# Patient Record
Sex: Male | Born: 1985 | Race: White | Hispanic: No | Marital: Married | Smoking: Never smoker
Health system: Southern US, Community
[De-identification: ages and names within clinical notes are randomized; demographics above are authoritative.]

## PROBLEM LIST (undated history)

## (undated) DIAGNOSIS — N4400 Torsion of testis, unspecified: Secondary | ICD-10-CM

## (undated) HISTORY — PX: HERNIA REPAIR: SHX51

## (undated) HISTORY — PX: TESTICLE REMOVAL: SHX68

## (undated) HISTORY — PX: TONSILLECTOMY: SUR1361

---

## 2012-03-18 DIAGNOSIS — Z9079 Acquired absence of other genital organ(s): Secondary | ICD-10-CM | POA: Insufficient documentation

## 2012-03-20 DIAGNOSIS — R7989 Other specified abnormal findings of blood chemistry: Secondary | ICD-10-CM | POA: Insufficient documentation

## 2012-04-06 DIAGNOSIS — E785 Hyperlipidemia, unspecified: Secondary | ICD-10-CM | POA: Insufficient documentation

## 2016-06-23 DIAGNOSIS — R1030 Lower abdominal pain, unspecified: Secondary | ICD-10-CM | POA: Diagnosis not present

## 2016-06-23 DIAGNOSIS — K219 Gastro-esophageal reflux disease without esophagitis: Secondary | ICD-10-CM | POA: Diagnosis not present

## 2016-11-16 DIAGNOSIS — M13862 Other specified arthritis, left knee: Secondary | ICD-10-CM | POA: Diagnosis not present

## 2016-11-16 DIAGNOSIS — M25562 Pain in left knee: Secondary | ICD-10-CM | POA: Diagnosis not present

## 2017-01-18 DIAGNOSIS — L237 Allergic contact dermatitis due to plants, except food: Secondary | ICD-10-CM | POA: Diagnosis not present

## 2017-01-18 DIAGNOSIS — T07XXXA Unspecified multiple injuries, initial encounter: Secondary | ICD-10-CM | POA: Diagnosis not present

## 2017-01-18 DIAGNOSIS — L089 Local infection of the skin and subcutaneous tissue, unspecified: Secondary | ICD-10-CM | POA: Diagnosis not present

## 2017-03-19 ENCOUNTER — Emergency Department: Payer: 59

## 2017-03-19 ENCOUNTER — Emergency Department
Admission: EM | Admit: 2017-03-19 | Discharge: 2017-03-19 | Disposition: A | Discharge status: Home / Self Care | Payer: 59 | Attending: Emergency Medicine | Admitting: Emergency Medicine

## 2017-03-19 DIAGNOSIS — M549 Dorsalgia, unspecified: Secondary | ICD-10-CM | POA: Diagnosis not present

## 2017-03-19 DIAGNOSIS — M7918 Myalgia, other site: Secondary | ICD-10-CM | POA: Insufficient documentation

## 2017-03-19 LAB — URINALYSIS, ROUTINE W REFLEX MICROSCOPIC
Bilirubin Urine: NEGATIVE
GLUCOSE, UA: NEGATIVE mg/dL
HGB URINE DIPSTICK: NEGATIVE
Ketones, ur: NEGATIVE mg/dL
LEUKOCYTES UA: NEGATIVE
Nitrite: NEGATIVE
PROTEIN: NEGATIVE mg/dL
SPECIFIC GRAVITY, URINE: 1.016 (ref 1.005–1.030)
pH: 5 (ref 5.0–8.0)

## 2017-03-19 MED ORDER — CYCLOBENZAPRINE HCL 5 MG PO TABS: ORAL_TABLET | ORAL | 0 refills | Status: DC

## 2017-03-19 MED ORDER — IBUPROFEN 800 MG PO TABS: 800.0000 mg | ORAL_TABLET | Freq: Three times a day (TID) | ORAL | 0 refills | Status: DC | PRN

## 2017-03-19 MED ORDER — ORPHENADRINE CITRATE 30 MG/ML IJ SOLN
60.0000 mg | Freq: Two times a day (BID) | INTRAMUSCULAR | Status: DC
  Administered 2017-03-19: 60 mg via INTRAMUSCULAR
  Filled 2017-03-19: qty 2

## 2017-03-19 MED ORDER — OXYCODONE-ACETAMINOPHEN 5-325 MG PO TABS
1.0000 | ORAL_TABLET | Freq: Once | ORAL | Status: AC
  Administered 2017-03-19: 1 via ORAL
  Filled 2017-03-19: qty 1

## 2017-03-19 MED ORDER — KETOROLAC TROMETHAMINE 30 MG/ML IJ SOLN
30.0000 mg | Freq: Once | INTRAMUSCULAR | Status: AC
  Administered 2017-03-19: 30 mg via INTRAMUSCULAR
  Filled 2017-03-19: qty 1

## 2017-03-19 NOTE — ED Notes (Signed)
Pt c/o right lower back pain states started over night, no c/o dysuria. No prior hx of kidney stones. Pt states he has had pain to his back on the right side before but never this bad.  Pt escorted to room via wheelchair from the lobby.

## 2017-03-19 NOTE — ED Triage Notes (Signed)
Pt c/o back pain that started this morning. Pt has pain of 8/10 and has taken tylenol for pain. Denies numbness and weakness in extremities.

## 2017-03-19 NOTE — ED Provider Notes (Signed)
[facility] Emergency Department Provider Note  ____________________________________________  Time seen: Approximately 9:51 AM  I have reviewed the triage vital signs and the nursing notes.   HISTORY  Chief Complaint Back Pain    HPI Franklin Walker is a 31 y.o. male that presents to the emergency department for evaluation of right mid back pain for one day. Patient woke up this morning with pain on the right side of his back. Pain does not radiate. It is worse with movement. This has happened before and is always in the same location.Tylenol has not helped. No fever, vomiting, abdominal pain, saddle paresthesias, bowel or bladder dysfunction, numbness, tingling.   History reviewed. No pertinent past medical history.  There are no active problems to display for this patient.   Past Surgical History:  Procedure Laterality Date  . HERNIA REPAIR    . TONSILLECTOMY      Prior to Admission medications   Medication Sig Start Date End Date Taking? Authorizing Provider  cyclobenzaprine (FLEXERIL) 5 MG tablet Take 1-2 tablets 3 times daily as needed 03/19/17   Laban Emperor, PA-C  ibuprofen (ADVIL,MOTRIN) 800 MG tablet Take 1 tablet (800 mg total) by mouth every 8 (eight) hours as needed. 03/19/17   Laban Emperor, PA-C    Allergies Sulfa antibiotics  No family history on file.  Social History Social History  Substance Use Topics  . Smoking status: Never Smoker  . Smokeless tobacco: Never Used  . Alcohol use No     Review of Systems  Constitutional: No fever/chills Cardiovascular: No chest pain. Respiratory: No SOB. Gastrointestinal: No abdominal pain.  No nausea, no vomiting.  Musculoskeletal: Positive for back pain.  Skin: Negative for rash, abrasions, lacerations, ecchymosis. Neurological: Negative for headaches, numbness or tingling   ____________________________________________   PHYSICAL EXAM:  VITAL SIGNS: ED Triage Vitals   Enc Vitals Group     BP 03/19/17 0718 (!) 150/91     Pulse Rate 03/19/17 0718 78     Resp --      Temp 03/19/17 0718 (!) 97.3 F (36.3 C)     Temp Source 03/19/17 0718 Oral     SpO2 03/19/17 0718 99 %     Weight 03/19/17 0719 235 lb (106.6 kg)     Height 03/19/17 0719 5\' 10"  (1.778 m)     Head Circumference --      Peak Flow --      Pain Score 03/19/17 0718 8     Pain Loc --      Pain Edu? --      Excl. in [place]? --      Constitutional: Alert and oriented. Well appearing and in no acute distress. Eyes: Conjunctivae are normal. PERRL. EOMI. Head: Atraumatic. ENT:      Ears:      Nose: No congestion/rhinnorhea.      Mouth/Throat: Mucous membranes are moist.  Neck: No stridor.   Cardiovascular: Normal rate, regular rhythm.  Good peripheral circulation. Respiratory: Normal respiratory effort without tachypnea or retractions. Lungs CTAB. Good air entry to the bases with no decreased or absent breath sounds. Gastrointestinal: Bowel sounds 4 quadrants. Soft and nontender to palpation. No guarding or rigidity. No palpable masses. No distention. No CVA tenderness. Musculoskeletal: Full range of motion to all extremities. No gross deformities appreciated.  Tenderness to palpation over right thoracic paraspinal muscles. Pain elicited with rotation of spine. Negative straight leg raise. Neurologic:  Normal speech and language. No gross focal neurologic deficits are  appreciated.  Skin:  Skin is warm, dry and intact. No rash noted.  ____________________________________________   LABS (all labs ordered are listed, but only abnormal results are displayed)  Labs Reviewed  URINALYSIS, ROUTINE W REFLEX MICROSCOPIC - Abnormal; Notable for the following:       Result Value   Color, Urine YELLOW (*)    APPearance CLEAR (*)    All other components within normal limits   ____________________________________________  EKG   ____________________________________________  RADIOLOGY Robinette Haines, personally viewed and evaluated these images (plain radiographs) as part of my medical decision making, as well as reviewing the written report by the radiologist.  Dg Thoracic Spine 2 View  Result Date: 03/19/2017 CLINICAL DATA:  Acute back pain. EXAM: THORACIC SPINE 2 VIEWS COMPARISON:  None. FINDINGS: There is no evidence of thoracic spine fracture. Alignment is normal. No other significant bone abnormalities are identified. IMPRESSION: Normal thoracic spine. Electronically Signed   By: Marijo Conception, M.D.   On: 03/19/2017 09:43    ____________________________________________    PROCEDURES  Procedure(s) performed:    Procedures    Medications  ketorolac (TORADOL) 30 MG/ML injection 30 mg (30 mg Intramuscular Given 03/19/17 0836)  oxyCODONE-acetaminophen (PERCOCET/ROXICET) 5-325 MG per tablet 1 tablet (1 tablet Oral Given 03/19/17 0945)     ____________________________________________    INITIAL IMPRESSION / ASSESSMENT AND PLAN / ED COURSE  Pertinent labs & imaging results that were available during my care of the patient were reviewed by me and considered in my medical decision making (see chart for details).  Review of the [place] CSRS was performed in accordance of the [place] prior to dispensing any controlled drugs.   Patient that presented to emergency department for evaluation of mid back pain for one day. Vital signs and exam are reassuring. Thoracic xray negative for acute bony abnormalities. No indication of infection or kidney stone on urinalysis. He is not having any abdominal pain. Patient felt better after norflex, Toradol, Roxicet. Patient will be discharged home with prescriptions for flexeril, ibuprofen. Patient is to follow up with PCP as directed. Patient is given ED precautions to return to the ED for any worsening or new symptoms.   ____________________________________________  FINAL CLINICAL IMPRESSION(S) / ED DIAGNOSES  Final diagnoses:   Musculoskeletal pain      NEW MEDICATIONS STARTED DURING THIS VISIT:  Discharge Medication List as of 03/19/2017 10:11 AM    START taking these medications   Details  cyclobenzaprine (FLEXERIL) 5 MG tablet Take 1-2 tablets 3 times daily as needed, Print    ibuprofen (ADVIL,MOTRIN) 800 MG tablet Take 1 tablet (800 mg total) by mouth every 8 (eight) hours as needed., Starting Fri 03/19/2017, Print            This chart was dictated using voice recognition software/Dragon. Despite best efforts to proofread, errors can occur which can change the meaning. Any change was purely unintentional.    Laban Emperor, PA-C 03/19/17 1459    Nena Polio, MD 03/19/17 1736

## 2017-06-22 DIAGNOSIS — M109 Gout, unspecified: Secondary | ICD-10-CM | POA: Diagnosis not present

## 2017-10-04 DIAGNOSIS — M109 Gout, unspecified: Secondary | ICD-10-CM | POA: Diagnosis not present

## 2017-11-19 DIAGNOSIS — M545 Low back pain: Secondary | ICD-10-CM | POA: Diagnosis not present

## 2017-11-19 DIAGNOSIS — Z0000 Encounter for general adult medical examination without abnormal findings: Secondary | ICD-10-CM | POA: Diagnosis not present

## 2017-11-19 DIAGNOSIS — M109 Gout, unspecified: Secondary | ICD-10-CM | POA: Diagnosis not present

## 2017-11-26 DIAGNOSIS — M109 Gout, unspecified: Secondary | ICD-10-CM | POA: Diagnosis not present

## 2017-11-26 DIAGNOSIS — Z0000 Encounter for general adult medical examination without abnormal findings: Secondary | ICD-10-CM | POA: Diagnosis not present

## 2017-12-06 ENCOUNTER — Other Ambulatory Visit: Payer: Self-pay

## 2018-01-19 DIAGNOSIS — R197 Diarrhea, unspecified: Secondary | ICD-10-CM | POA: Diagnosis not present

## 2018-01-28 ENCOUNTER — Ambulatory Visit (INDEPENDENT_AMBULATORY_CARE_PROVIDER_SITE_OTHER): Payer: 59 | Admitting: Urology

## 2018-01-28 ENCOUNTER — Encounter: Payer: Self-pay | Admitting: Urology

## 2018-01-28 ENCOUNTER — Other Ambulatory Visit: Payer: Self-pay

## 2018-01-28 VITALS — BP 110/72 | HR 91 | Ht 71.0 in | Wt 247.9 lb

## 2018-01-28 DIAGNOSIS — R7989 Other specified abnormal findings of blood chemistry: Secondary | ICD-10-CM | POA: Diagnosis not present

## 2018-01-28 NOTE — Progress Notes (Signed)
   01/28/2018 2:21 PM   Gabriela Eves September 20, 1985 [id number]  Referring provider: Sofie Hartigan, MD [place], [place] [address]  CC: Referred for low testosterone  HPI: I had the pleasure of seeing Mr. Cranshaw in urology clinic today in consultation for low testosterone on laboratory finding from Dr. Ellison Hughs.  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  He denies fatigue, low libido, erectile dysfunction.  He is married with one child and interested in having further children.  The severity is mild, there are no aggravating or alleviating factors, the duration is 2 months.   PMH: History reviewed. No pertinent past medical history.  Surgical History: Past Surgical History:  Procedure Laterality Date  . HERNIA REPAIR    . TONSILLECTOMY      Allergies:  Allergies  Allergen Reactions  . Sulfa Antibiotics     Family History: Family History  Problem Relation Age of Onset  . Prostate cancer Neg Hx   . Bladder Cancer Neg Hx   . Kidney cancer Neg Hx     Social History:  reports that he has never smoked. He has never used smokeless tobacco. He reports that he does not drink alcohol or use drugs.  ROS: Please see flowsheet from today's date for complete review of systems.  Physical Exam: BP 110/72   Pulse 91   Ht 5\' 11"  (1.803 m)   Wt 247 lb 14.4 oz (112.4 kg)   BMI 34.58 kg/m    Constitutional:  Alert and oriented, No acute distress. Cardiovascular: No clubbing, cyanosis, or edema. Respiratory: Normal respiratory effort, no increased work of breathing. GI: Abdomen is soft, nontender, nondistended, no abdominal masses GU: No CVA tenderness, phallus without lesions, widely patent meatus, left testicle surgically absent, right testicle descended, ~30cc no masses Lymph: No cervical or inguinal lymphadenopathy. Skin: No rashes, bruises or suspicious  lesions. Neurologic: Grossly intact, no focal deficits, moving all 4 extremities. Psychiatric: Normal mood and affect.  Laboratory Data: Testosterone 266 (11/2017)  Pertinent Imaging: None to review  Assessment & Plan:   In summary, Mr. Lagrand is a healthy 32 year old male with history of left orchiectomy for torsion and testosterone level of 266 in June 2019.  He is married with one child and does desire further pregnancies.  Discussed at length the relationship between exogenous testosterone and infertility.  He is minimally symptomatic at this time, however if he were to have significant symptoms including severe fatigue or low libido we would recommend either Clomid or anastrozole in the setting of desire for fertility.  Follow up as needed  Return if symptoms worsen or fail to improve.  Billey Co, [facility] [address] [place], [place] [address] [phone]

## 2018-02-01 DIAGNOSIS — R197 Diarrhea, unspecified: Secondary | ICD-10-CM | POA: Diagnosis not present

## 2018-02-02 DIAGNOSIS — R197 Diarrhea, unspecified: Secondary | ICD-10-CM | POA: Diagnosis not present

## 2018-02-04 ENCOUNTER — Ambulatory Visit: Payer: Self-pay | Admitting: Urology

## 2018-02-09 ENCOUNTER — Ambulatory Visit: Payer: Self-pay | Admitting: Urology

## 2018-03-18 DIAGNOSIS — B078 Other viral warts: Secondary | ICD-10-CM | POA: Diagnosis not present

## 2018-03-18 DIAGNOSIS — D1800 Hemangioma unspecified site: Secondary | ICD-10-CM | POA: Diagnosis not present

## 2018-03-18 DIAGNOSIS — D229 Melanocytic nevi, unspecified: Secondary | ICD-10-CM | POA: Diagnosis not present

## 2018-03-18 DIAGNOSIS — L570 Actinic keratosis: Secondary | ICD-10-CM | POA: Diagnosis not present

## 2018-04-08 DIAGNOSIS — B078 Other viral warts: Secondary | ICD-10-CM | POA: Diagnosis not present

## 2018-05-13 DIAGNOSIS — B078 Other viral warts: Secondary | ICD-10-CM | POA: Diagnosis not present

## 2019-01-21 ENCOUNTER — Other Ambulatory Visit: Payer: Self-pay

## 2019-01-21 ENCOUNTER — Encounter: Payer: Self-pay | Admitting: Emergency Medicine

## 2019-01-21 ENCOUNTER — Emergency Department: Payer: 59

## 2019-01-21 ENCOUNTER — Emergency Department
Admission: EM | Admit: 2019-01-21 | Discharge: 2019-01-21 | Disposition: A | Discharge status: Home / Self Care | Payer: 59 | Attending: Emergency Medicine | Admitting: Emergency Medicine

## 2019-01-21 DIAGNOSIS — Z882 Allergy status to sulfonamides status: Secondary | ICD-10-CM | POA: Diagnosis not present

## 2019-01-21 DIAGNOSIS — R079 Chest pain, unspecified: Secondary | ICD-10-CM

## 2019-01-21 DIAGNOSIS — E785 Hyperlipidemia, unspecified: Secondary | ICD-10-CM | POA: Insufficient documentation

## 2019-01-21 DIAGNOSIS — R0602 Shortness of breath: Secondary | ICD-10-CM | POA: Insufficient documentation

## 2019-01-21 DIAGNOSIS — R0789 Other chest pain: Secondary | ICD-10-CM | POA: Diagnosis not present

## 2019-01-21 HISTORY — DX: Torsion of testis, unspecified: N44.00

## 2019-01-21 LAB — BASIC METABOLIC PANEL
Anion gap: 10 (ref 5–15)
BUN: 14 mg/dL (ref 6–20)
CO2: 23 mmol/L (ref 22–32)
Calcium: 9.1 mg/dL (ref 8.9–10.3)
Chloride: 108 mmol/L (ref 98–111)
Creatinine, Ser: 0.93 mg/dL (ref 0.61–1.24)
GFR calc Af Amer: >60 mL/min (ref >60)
GFR calc non Af Amer: >60 mL/min (ref >60)
Glucose, Bld: 111 mg/dL — ABNORMAL HIGH (ref 70–99)
Potassium: 3.8 mmol/L (ref 3.5–5.1)
Sodium: 141 mmol/L (ref 135–145)

## 2019-01-21 LAB — CBC
HCT: 43.5 % (ref 39.0–52.0)
Hemoglobin: 14.7 g/dL (ref 13.0–17.0)
MCH: 32.9 pg (ref 26.0–34.0)
MCHC: 33.8 g/dL (ref 30.0–36.0)
MCV: 97.3 fL (ref 80.0–100.0)
Platelets: 411 10*3/uL — ABNORMAL HIGH (ref 150–400)
RBC: 4.47 MIL/uL (ref 4.22–5.81)
RDW: 12.4 % (ref 11.5–15.5)
WBC: 10.7 10*3/uL — ABNORMAL HIGH (ref 4.0–10.5)
nRBC: 0 % (ref 0.0–0.2)

## 2019-01-21 LAB — TROPONIN I (HIGH SENSITIVITY): Troponin I (High Sensitivity): <2 ng/L (ref <18)

## 2019-01-21 MED ORDER — SUCRALFATE 1 G PO TABS: 1.0000 g | ORAL_TABLET | Freq: Four times a day (QID) | ORAL | 0 refills | Status: AC

## 2019-01-21 MED ORDER — FAMOTIDINE 20 MG PO TABS: 20.0000 mg | ORAL_TABLET | Freq: Every day | ORAL | 1 refills | Status: AC

## 2019-01-21 NOTE — ED Provider Notes (Signed)
[facility] Emergency Department Provider Note   ____________________________________________   I have reviewed the triage vital signs and the nursing notes.   HISTORY  Chief Complaint Chest Pain and Shortness of Breath   History limited by: Not Limited   HPI Franklin Walker is a 34 y.o. male who presents to the emergency department today because of concern for chest pain and some shortness of breath.  He states that the chest pain started yesterday.  It is located in the left chest.  It has been intermittent.  He thinks that it might be more common after he eats.  He states that when he relaxes it will then go away.  He denies any fevers.  Denies any nausea or vomiting.  The patient has history of GERD although he states it was a long time ago.  Patient did recently move and states he has been under some slightly increased stress.   Records reviewed. Per medical record review patient has a history of dyslipidemia.  Past Medical History:  Diagnosis Date  . Testicular torsion     Patient Active Problem List   Diagnosis Date Noted  . Dyslipidemia 04/06/2012  . Low testosterone 03/20/2012  . 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 HERNIA REPAIR    . TESTICLE REMOVAL    . TONSILLECTOMY      Prior to Admission medications   Not on File    Allergies Sulfa antibiotics  Family History  Problem Relation Age of Onset  . Prostate cancer Neg Hx   . Bladder Cancer Neg Hx   . Kidney cancer Neg Hx     Social History Social History   Tobacco Use  . Smoking status: Never Smoker  . Smokeless tobacco: Never Used  Substance Use Topics  . Alcohol use: No  . Drug use: No    Review of Systems Constitutional: No fever/chills Eyes: No visual changes. ENT: No sore throat. Cardiovascular: Positive for chest pain. Respiratory: Positive for shortness of breath. Gastrointestinal: No  abdominal pain.  No nausea, no vomiting.  No diarrhea.   Genitourinary: Negative for dysuria. Musculoskeletal: Negative for back pain. Skin: Negative for rash. Neurological: Negative for headaches, focal weakness or numbness.  ____________________________________________   PHYSICAL EXAM:  VITAL SIGNS: ED Triage Vitals  Enc Vitals Group     BP 01/21/19 1902 (!) 142/92     Pulse Rate 01/21/19 1902 97     Resp 01/21/19 1902 20     Temp 01/21/19 1902 98.1 F (36.7 C)     Temp Source 01/21/19 1902 Oral     SpO2 01/21/19 1902 96 %     Weight 01/21/19 1859 250 lb (113.4 kg)     Height 01/21/19 1859 5\' 10"  (1.778 m)     Head Circumference --      Peak Flow --      Pain Score 01/21/19 1858 2   Constitutional: Alert and oriented.  Eyes: Conjunctivae are normal.  ENT      Head: Normocephalic and atraumatic.      Nose: No congestion/rhinnorhea.      Mouth/Throat: Mucous membranes are moist.      Neck: No stridor. Cardiovascular: Normal rate, regular rhythm.  No murmurs, rubs, or gallops.  Respiratory: Normal respiratory effort without tachypnea nor retractions. Breath sounds are clear and equal bilaterally. No wheezes/rales/rhonchi. Gastrointestinal: Soft and non tender. No rebound. No guarding.  Genitourinary: Deferred Musculoskeletal: Normal range  of motion in all extremities.  Neurologic:  Normal speech and language. No gross focal neurologic deficits are appreciated.  Skin:  Skin is warm, dry and intact. No rash noted. Psychiatric: Mood and affect are normal. Speech and behavior are normal. Patient exhibits appropriate insight and judgment.  ____________________________________________    LABS (pertinent positives/negatives)  Trop hs <2 CBC wbc 10.7, hgb 14.7, plt 411 BMP wnl except glu 111  ____________________________________________   EKG  I, Nance Pear, attending physician, personally viewed and interpreted this EKG  EKG Time: 1904 Rate: 92 Rhythm: normal  sinus rhythm Axis: normal Intervals: qtc 442 QRS: narrow ST changes: no st elevation Impression: normal ekg  ____________________________________________    RADIOLOGY  CXR No acute abnormality  ____________________________________________   PROCEDURES  Procedures  ____________________________________________   INITIAL IMPRESSION / ASSESSMENT AND PLAN / ED COURSE  Pertinent labs & imaging results that were available during my care of the patient were reviewed by me and considered in my medical decision making (see chart for details).   Patient presented to the emergency department today with concerns for intermittent chest pain and shortness of breath that started yesterday.  He does state that it is somewhat worse after eating.  Work-up here without concerning findings on EKG chest x-ray or blood work.  This point I have low suspicion for ACS.  Also do not suspect PE, aortic dissection, pneumonia or pneumothorax.  Do wonder if patient is suffering from esophagitis.  Discussed this with the patient.  Will plan on giving patient prescription for sucralfate and antiacid.  Will give patient information on dietary changes.  Discussed with patient portance of primary care follow-up.   ____________________________________________   FINAL CLINICAL IMPRESSION(S) / ED DIAGNOSES  Final diagnoses:  Nonspecific chest pain     Note: This dictation was prepared with Dragon dictation. Any transcriptional errors that result from this process are unintentional     Nance Pear, MD 01/21/19 2019

## 2019-01-21 NOTE — ED Triage Notes (Signed)
Pt presents to ED via POV with c/o L sided CP and SOB. Pt states intermittent CP x 2 days, that is cramping in nature. Denies radiation, also c/o fatigue.

## 2019-01-21 NOTE — Discharge Instructions (Addendum)
Please seek medical attention for any high fevers, chest pain, shortness of breath, change in behavior, persistent vomiting, bloody stool or any other new or concerning symptoms.  

## 2019-01-21 NOTE — ED Notes (Signed)
Pt not wearing mask. Pt instructed on mask use.

## 2020-02-05 ENCOUNTER — Other Ambulatory Visit: Payer: Self-pay | Admitting: Family Medicine

## 2020-02-05 DIAGNOSIS — I10 Essential (primary) hypertension: Secondary | ICD-10-CM

## 2020-02-16 ENCOUNTER — Ambulatory Visit
Admission: RE | Admit: 2020-02-16 | Discharge: 2020-02-16 | Disposition: A | Discharge status: Home / Self Care | Payer: 59 | Source: Ambulatory Visit | Attending: Family Medicine | Admitting: Family Medicine

## 2020-02-16 ENCOUNTER — Other Ambulatory Visit: Payer: Self-pay

## 2020-02-16 DIAGNOSIS — I10 Essential (primary) hypertension: Secondary | ICD-10-CM | POA: Diagnosis not present

## 2020-02-26 ENCOUNTER — Ambulatory Visit (INDEPENDENT_AMBULATORY_CARE_PROVIDER_SITE_OTHER): Payer: 59 | Admitting: Vascular Surgery

## 2020-02-26 ENCOUNTER — Other Ambulatory Visit: Payer: Self-pay

## 2020-02-26 ENCOUNTER — Encounter (INDEPENDENT_AMBULATORY_CARE_PROVIDER_SITE_OTHER): Payer: Self-pay | Admitting: Vascular Surgery

## 2020-02-26 VITALS — BP 156/92 | HR 90 | Resp 16 | Ht 70.0 in | Wt 250.6 lb

## 2020-02-26 DIAGNOSIS — E785 Hyperlipidemia, unspecified: Secondary | ICD-10-CM | POA: Diagnosis not present

## 2020-02-26 DIAGNOSIS — K219 Gastro-esophageal reflux disease without esophagitis: Secondary | ICD-10-CM | POA: Diagnosis not present

## 2020-02-26 DIAGNOSIS — I701 Atherosclerosis of renal artery: Secondary | ICD-10-CM | POA: Diagnosis not present

## 2020-02-26 DIAGNOSIS — I10 Essential (primary) hypertension: Secondary | ICD-10-CM

## 2020-02-27 ENCOUNTER — Encounter (INDEPENDENT_AMBULATORY_CARE_PROVIDER_SITE_OTHER): Payer: Self-pay | Admitting: Vascular Surgery

## 2020-02-27 DIAGNOSIS — I10 Essential (primary) hypertension: Secondary | ICD-10-CM | POA: Insufficient documentation

## 2020-02-27 DIAGNOSIS — K219 Gastro-esophageal reflux disease without esophagitis: Secondary | ICD-10-CM | POA: Insufficient documentation

## 2020-02-27 DIAGNOSIS — I701 Atherosclerosis of renal artery: Secondary | ICD-10-CM | POA: Insufficient documentation

## 2020-02-27 NOTE — Progress Notes (Signed)
MRN : [id number]  Franklin Walker is a 34 y.o. (10/25/1985) male who presents with chief complaint of  Chief Complaint  Patient presents with  . New Patient (Initial Visit)    ref [facility]  .  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 The patient has a long history of hypertension which recently has become increasingly difficult to control utilizing medical therapy. The patient is consistently documented systolic blood pressures near 409 with diastolic pressures about 90.  His initial complaints were lightheadedness and dizziness associated with headache following these episodes he would "feel exhausted".  This led to an evaluation of his blood pressure.  Duplex ultrasound was obtained 02/16/2020 which showed velocities on the right of 175 cm/s at the origin and on the left of 234 cm/s.  Kidney length is normal.  The patient does have family history of hypertension.   There is no prior documented abdominal bruit. The patient occasionally has flushing symptoms but denies palpitations. No episodes of syncope.There is no history of headache. There is no history of flash pulmonary edema.  The patient denies a history of renal disease.  The patient denies amaurosis fugax or recent TIA symptoms. There are no recent neurological changes noted. The patient denies claudication symptoms or rest pain symptoms. The patient denies history of DVT, PE or superficial thrombophlebitis. The patient denies recent episodes of angina or shortness of breath.     Current Meds  Medication Sig  . amLODipine (NORVASC) 2.5 MG tablet Take 2.5 mg by mouth daily.  . indomethacin (INDOCIN) 50 MG capsule Take by mouth.  . losartan (COZAAR) 100 MG tablet Take 50 mg by mouth daily.   . Omega-3 Fatty Acids (FISH OIL PO) Take 300 mg by mouth in the morning and at bedtime.    Past Medical History:  Diagnosis Date  .  Testicular torsion     Past Surgical History:  Procedure Laterality Date  . HERNIA REPAIR    . TESTICLE REMOVAL    . TONSILLECTOMY      Social History Social History   Tobacco Use  . Smoking status: Never Smoker  . Smokeless tobacco: Never Used  Substance Use Topics  . Alcohol use: No  . Drug use: No    Family History Family History  Problem Relation Age of Onset  . Thyroid disease Mother   . Breast cancer Maternal Grandmother   . Heart disease Maternal Grandfather   . Heart disease Paternal Grandfather   . Prostate cancer Neg Hx   . Bladder Cancer Neg Hx   . Kidney cancer Neg Hx   No family history of bleeding/clotting disorders, porphyria or autoimmune disease   Allergies  Allergen Reactions  . Sulfa Antibiotics     Other reaction(s): GI Intolerance     REVIEW OF SYSTEMS (Negative unless checked)  Constitutional: [] Weight loss  [] Fever  [] Chills Cardiac: [] Chest pain   [] Chest pressure   [] Palpitations   [] Shortness of breath when laying flat   [] Shortness of breath with exertion. Vascular:  [] Pain in legs with walking   [] Pain in legs at rest  [] History of DVT   [] Phlebitis   [] Swelling in legs   [] Varicose veins   [] Non-healing ulcers Pulmonary:   [] Uses home oxygen   [] Productive cough   [] Hemoptysis   [] Wheeze  [] COPD   [] Asthma Neurologic:  [x] Dizziness   [] Seizures   [] History of stroke   []   History of TIA  [] Aphasia   [] Vissual changes   [] Weakness or numbness in arm   [] Weakness or numbness in leg Musculoskeletal:   [] Joint swelling   [] Joint pain   [] Low back pain Hematologic:  [] Easy bruising  [] Easy bleeding   [] Hypercoagulable state   [] Anemic Gastrointestinal:  [] Diarrhea   [] Vomiting  [] Gastroesophageal reflux/heartburn   [] Difficulty swallowing. Genitourinary:  [] Chronic kidney disease   [] Difficult urination  [] Frequent urination   [] Blood in urine Skin:  [] Rashes   [] Ulcers  Psychological:  [] History of anxiety   []  History of major  depression.  Physical Examination  Vitals:   02/26/20 0901  BP: (!) 156/92  Pulse: 90  Resp: 16  Weight: 250 lb 9.6 oz (113.7 kg)  Height: 5\' 10"  (1.778 m)   Body mass index is 35.96 kg/m. Gen: WD/WN, NAD Head: [place]/AT, No temporalis wasting.  Ear/Nose/Throat: Hearing grossly intact, nares w/o erythema or drainage, poor dentition Eyes: PER, EOMI, sclera nonicteric.  Neck: Supple, no masses.  No bruit or JVD.  Pulmonary:  Good air movement, clear to auscultation bilaterally, no use of accessory muscles.  Cardiac: RRR, normal S1, S2, no Murmurs. Vascular: no abdominal bruits noted Vessel Right Left  Radial Palpable Palpable  Gastrointestinal: soft, non-distended. No guarding/no peritoneal signs.  Musculoskeletal: M/S 5/5 throughout.  No deformity or atrophy.  Neurologic: CN 2-12 intact. Pain and light touch intact in extremities.  Symmetrical.  Speech is fluent. Motor exam as listed above. Psychiatric: Judgment intact, Mood & affect appropriate for pt's clinical situation. Dermatologic: No rashes or ulcers noted.  No changes consistent with cellulitis.  CBC Lab Results  Component Value Date   WBC 10.7 (H) 01/21/2019   HGB 14.7 01/21/2019   HCT 43.5 01/21/2019   MCV 97.3 01/21/2019   PLT 411 (H) 01/21/2019    BMET    Component Value Date/Time   NA 141 01/21/2019 1909   K 3.8 01/21/2019 1909   CL 108 01/21/2019 1909   CO2 23 01/21/2019 1909   GLUCOSE 111 (H) 01/21/2019 1909   BUN 14 01/21/2019 1909   CREATININE 0.93 01/21/2019 1909   CALCIUM 9.1 01/21/2019 1909   GFRNONAA >60 01/21/2019 1909   GFRAA >60 01/21/2019 1909   CrCl cannot be calculated (Patient's most recent lab result is older than the maximum 21 days allowed.).  COAG No results found for: INR, PROTIME  Radiology US RENAL ARTERY DUPLEX COMPLETE  Result Date: 02/16/2020 CLINICAL DATA:  34 year old male with a history of uncontrolled hypertension EXAM: RENAL/URINARY TRACT ULTRASOUND RENAL DUPLEX  DOPPLER ULTRASOUND COMPARISON:  None. FINDINGS: Right Kidney: Length: 10.4 cm. Echogenicity within normal limits. No mass or hydronephrosis visualized. Left Kidney: Length: 11.4 cm. Echogenicity within normal limits. No mass or hydronephrosis visualized. Bladder:  Unremarkable RENAL DUPLEX ULTRASOUND Right Renal Artery Velocities: Origin:  175 cm/sec Mid:  183 cm/sec [place]:  155 cm/sec Interlobar:  23 cm/sec Arcuate:  21 cm/sec Left Renal Artery Velocities: Origin:  234 cm/sec Mid:  184 cm/sec Hilum:  103 cm/sec Interlobar:  26 cm/sec Arcuate:  26 cm/sec Aortic Velocity:  98 cm/sec Right Renal-Aortic Ratios: Origin: 1.8 Mid:  1.9 Hilum: 1.6 Interlobar: 0.2 Arcuate: 0.2 Left Renal-Aortic Ratios: Origin: 2.4 Mid: 1.9 [place]: 1.1 Interlobar: 0.3 Arcuate: 0.3 IMPRESSION: Directed duplex of the left renal artery demonstrates elevated velocity at the origin, compatible with developing stenosis. Further evaluation with a more specific test such as formal angiogram or CT angiogram may be useful. Signed, Dulcy Fanny. Dellia Nims, [facility] Radiology Electronically Signed   By: Corrie Mckusick D.O.   On: 02/16/2020 13:13      Assessment/Plan 1. Renal artery stenosis (HCC) Given patient's arterial disease optimal control of the patient's hypertension is important.  BP is not acceptable today but the patient is on only two medications at this time  The patient's vital signs and noninvasive studies support the renal artery stenosis is not necessarily significant given the lack of 3 medications and the relatively low dose of his amlodipine.  No invasive studies or intervention is going to be pursued at this time.  After a long discussion with the patient he wishes to be more aggressive with medications.  He is reluctant to undergo angiography.  He has agreed to follow-up in 6 months with a repeat duplex ultrasound.  The patient will continue the current  antihypertensive medications, changes per primary service.  The primary medical service will continue aggressive antihypertensive therapy as per the AHA guidelines  - VAS US RENAL ARTERY DUPLEX; Future  2. Accelerated hypertension See #1 - VAS US RENAL ARTERY DUPLEX; Future  3. Dyslipidemia Continue statin as ordered and reviewed, no changes at this time   4. Gastroesophageal reflux disease without esophagitis Continue PPI as already ordered, this medication has been reviewed and there are no changes at this time.  Avoidence of caffeine and alcohol  Moderate elevation of the head of the bed    Hortencia Pilar, MD  02/27/2020 9:36 AM

## 2020-08-19 ENCOUNTER — Encounter (INDEPENDENT_AMBULATORY_CARE_PROVIDER_SITE_OTHER): Payer: 59

## 2020-08-22 ENCOUNTER — Ambulatory Visit (INDEPENDENT_AMBULATORY_CARE_PROVIDER_SITE_OTHER): Payer: 59 | Admitting: Vascular Surgery

## 2020-08-22 ENCOUNTER — Encounter (INDEPENDENT_AMBULATORY_CARE_PROVIDER_SITE_OTHER): Payer: 59

## 2021-06-01 IMAGING — US US RENAL ARTERY STENOSIS
1 series · 14 of 25 positions shown · non-contrast
Comparison: None.

CLINICAL DATA: 34-year-old male with a history of uncontrolled
hypertension

EXAM:
RENAL/URINARY TRACT ULTRASOUND
RENAL DUPLEX DOPPLER ULTRASOUND

[Series 1: us renal artery stenosis · 0.31mm/px · 14 of 75 slices shown]
[im 1/75]
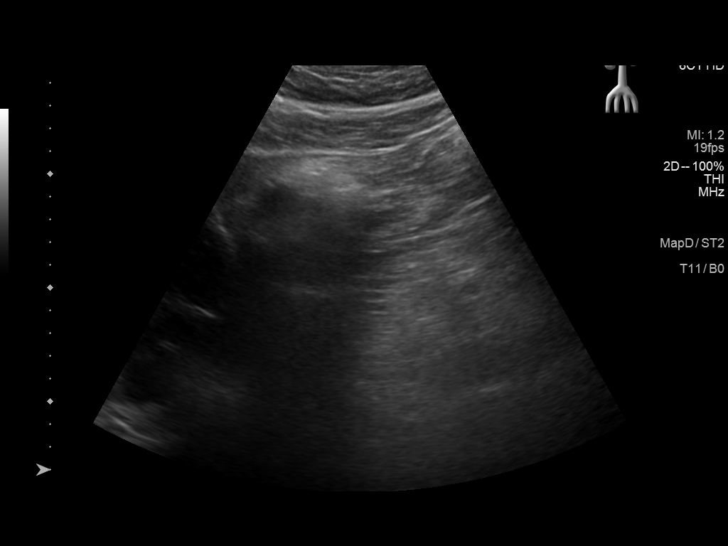
[im 7/75]
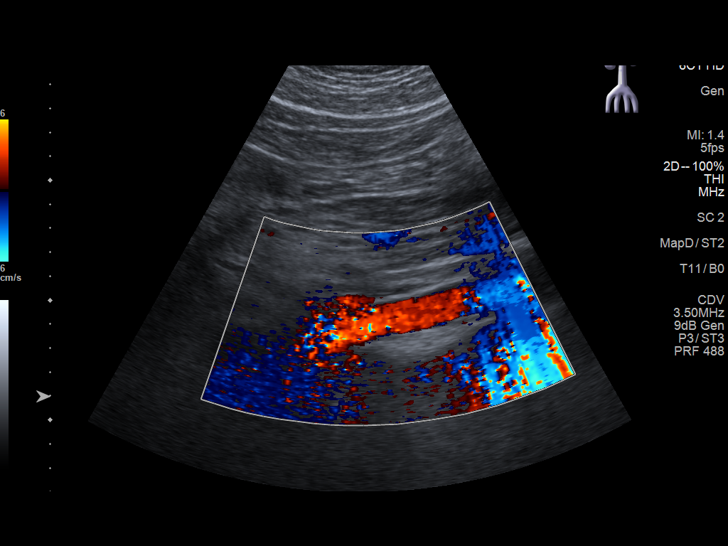
[im 13/75]
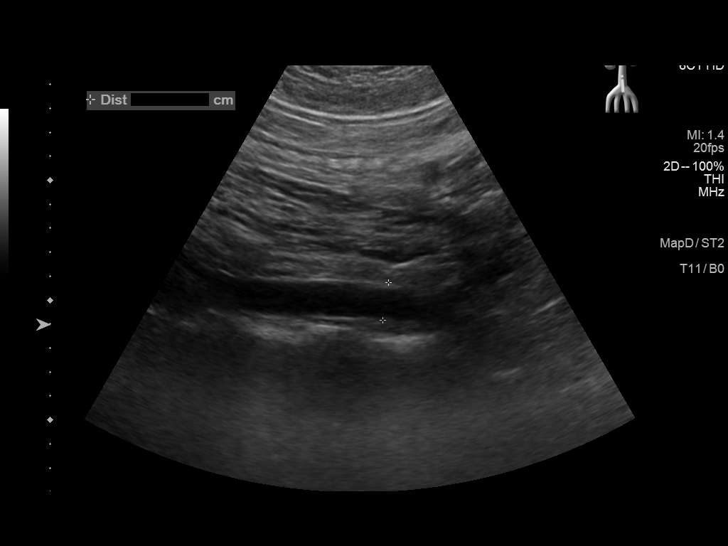
[im 19/75]
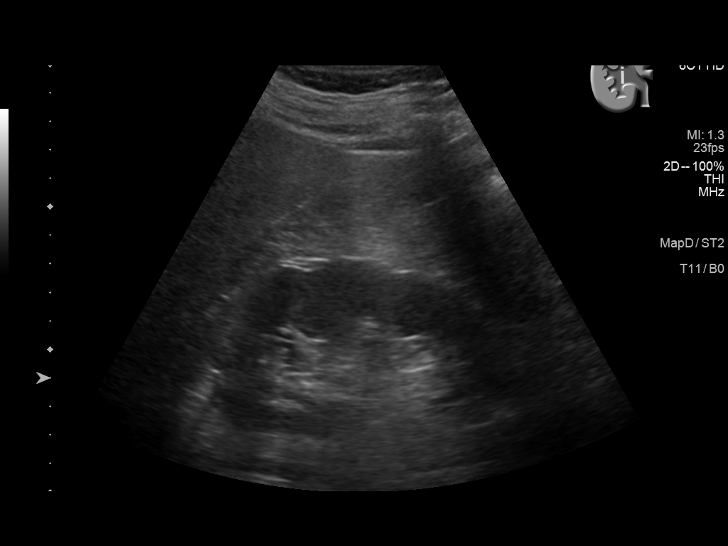
[im 25/75]
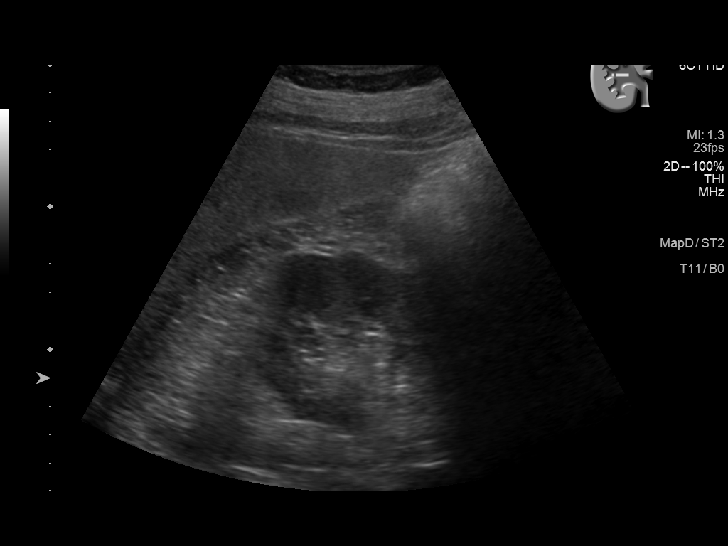
[im 28/75]
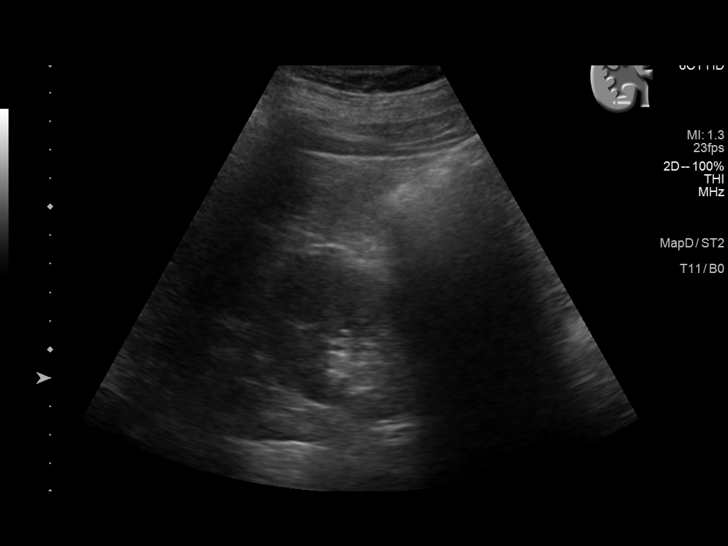
[im 34/75]
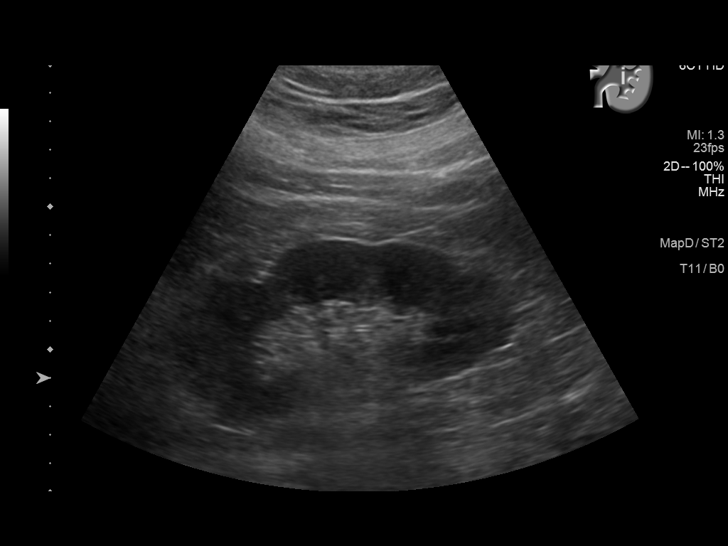
[im 41/75]
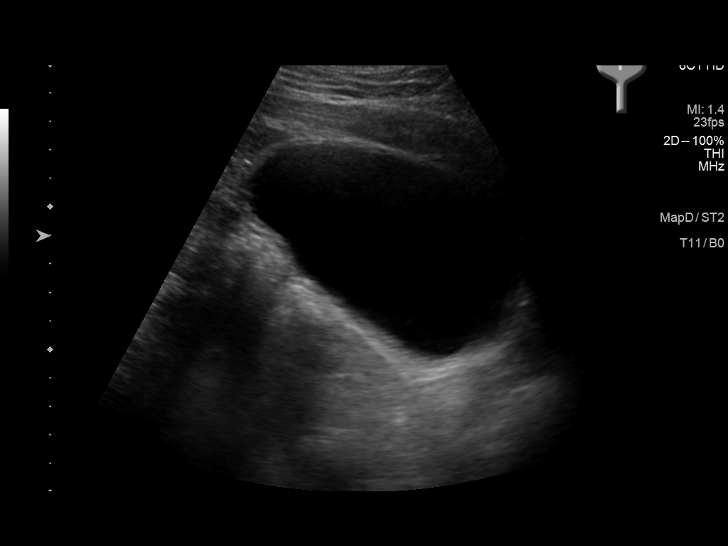
[im 47/75]
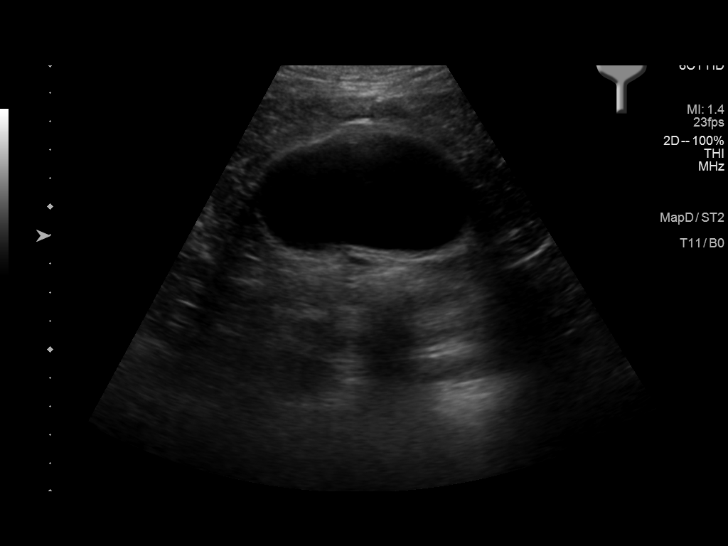
[im 50/75]
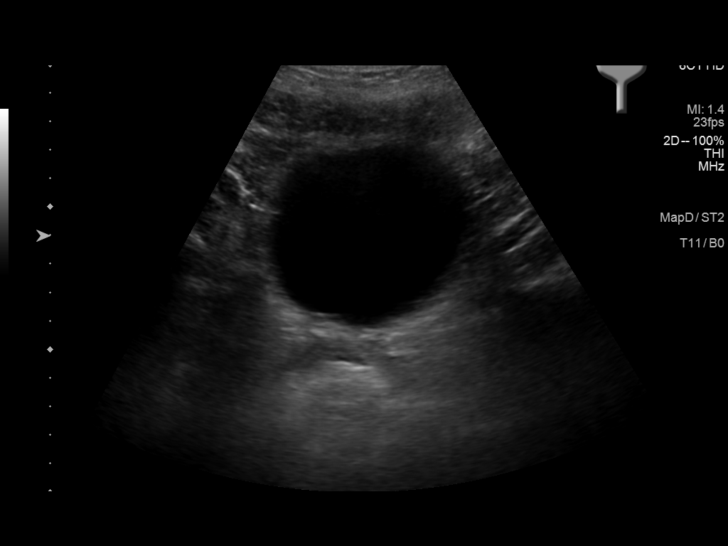
[im 56/75]
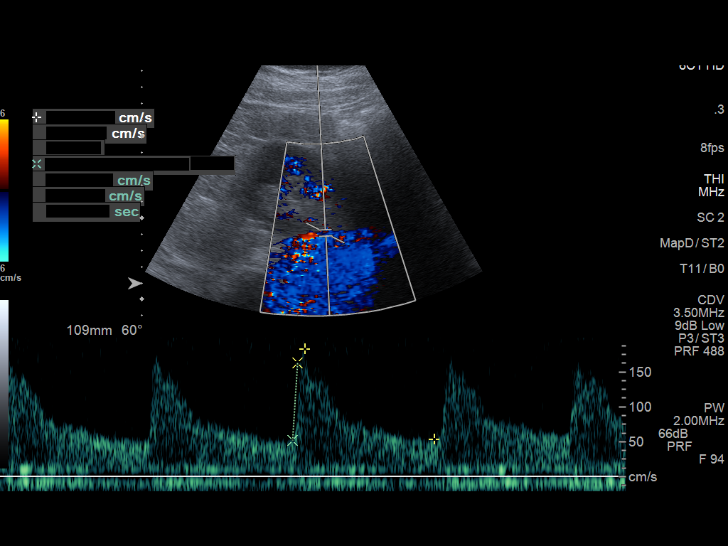
[im 62/75]
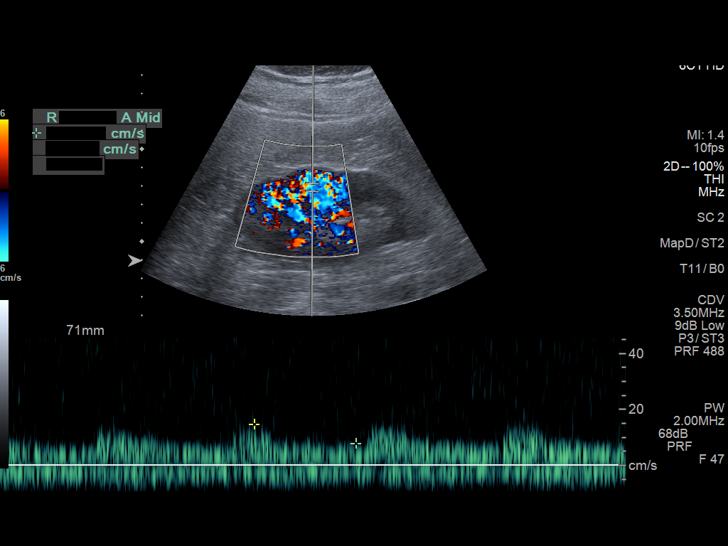
[im 68/75]
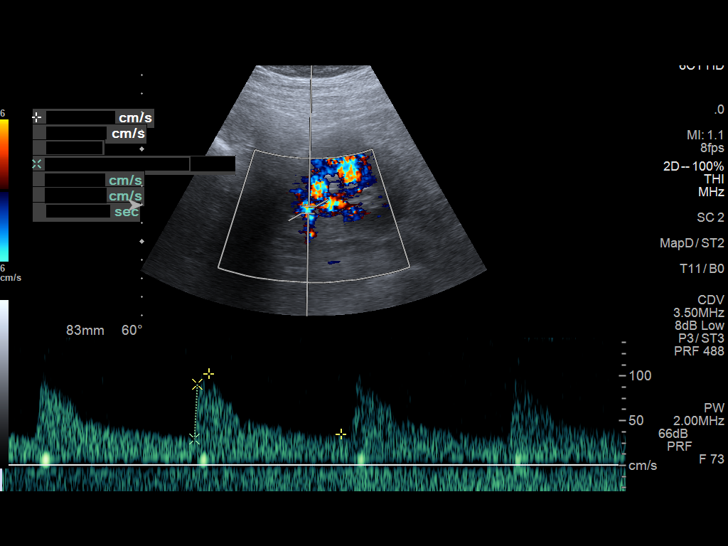
[im 75/75]
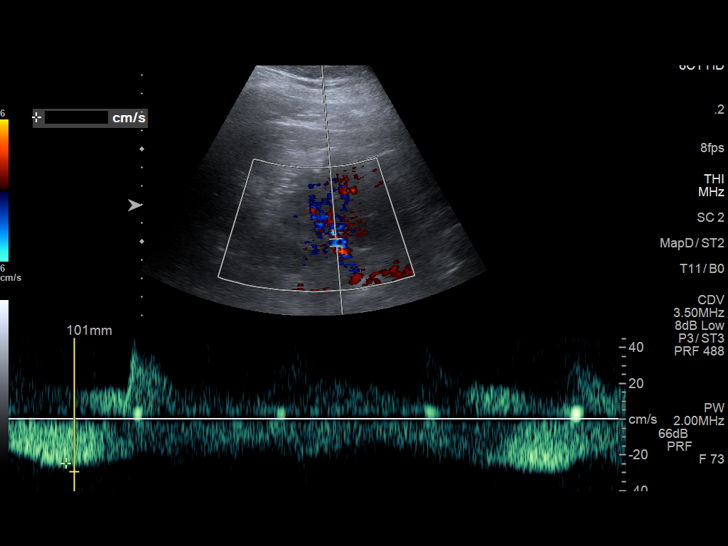

[14 of 25 positions shown; findings below may reference images not displayed]

FINDINGS: Right Kidney:

Length: 10.4 cm. Echogenicity within normal limits. No mass or
hydronephrosis visualized.

Left Kidney:

Length: 11.4 cm. Echogenicity within normal limits. No mass or
hydronephrosis visualized.

Bladder:  Unremarkable

RENAL DUPLEX ULTRASOUND

Right Renal Artery Velocities:

Origin:  175 cm/sec

Mid:  183 cm/sec

Hilum:  155 cm/sec

Interlobar:  23 cm/sec

Arcuate:  21 cm/sec

Left Renal Artery Velocities:

Origin:  234 cm/sec

Mid:  184 cm/sec

Hilum:  103 cm/sec

Interlobar:  26 cm/sec

Arcuate:  26 cm/sec

Aortic Velocity:  98 cm/sec

Right Renal-Aortic Ratios:

Origin:

Mid:

Hilum:

Interlobar:

Arcuate:

Left Renal-Aortic Ratios:

Origin:

Mid:

Hilum:

Interlobar:

Arcuate:
IMPRESSION: Directed duplex of the left renal artery demonstrates elevated
velocity at the origin, compatible with developing stenosis. Further
evaluation with a more specific test such as formal angiogram or CT
angiogram may be useful.
